# Patient Record
Sex: Male | Born: 2000 | Hispanic: Yes | Marital: Single | Smoking: Never smoker
Health system: Southern US, Community
[De-identification: ages and names within clinical notes are randomized; demographics above are authoritative.]

## PROBLEM LIST (undated history)

## (undated) ENCOUNTER — Emergency Department (HOSPITAL_COMMUNITY): Admission: EM | Payer: Self-pay | Source: Home / Self Care

---
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
## 2018-01-25 ENCOUNTER — Emergency Department (HOSPITAL_COMMUNITY)
Admission: EM | Admit: 2018-01-25 | Discharge: 2018-01-25 | Disposition: A | Discharge status: Home / Self Care | Payer: Self-pay | Attending: Emergency Medicine | Admitting: Emergency Medicine

## 2018-01-25 ENCOUNTER — Encounter (HOSPITAL_COMMUNITY): Payer: Self-pay | Admitting: Nurse Practitioner

## 2018-01-25 DIAGNOSIS — F10120 Alcohol abuse with intoxication, uncomplicated: Secondary | ICD-10-CM | POA: Insufficient documentation

## 2018-01-25 DIAGNOSIS — S61411A Laceration without foreign body of right hand, initial encounter: Secondary | ICD-10-CM | POA: Insufficient documentation

## 2018-01-25 DIAGNOSIS — W228XXA Striking against or struck by other objects, initial encounter: Secondary | ICD-10-CM | POA: Insufficient documentation

## 2018-01-25 DIAGNOSIS — Y929 Unspecified place or not applicable: Secondary | ICD-10-CM | POA: Insufficient documentation

## 2018-01-25 DIAGNOSIS — Y999 Unspecified external cause status: Secondary | ICD-10-CM | POA: Insufficient documentation

## 2018-01-25 DIAGNOSIS — F10921 Alcohol use, unspecified with intoxication delirium: Secondary | ICD-10-CM

## 2018-01-25 DIAGNOSIS — Y939 Activity, unspecified: Secondary | ICD-10-CM | POA: Insufficient documentation
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
## 2018-01-25 NOTE — Discharge Instructions (Addendum)
As discussed, your evaluation today has been largely reassuring.  But, it is important that you monitor your condition carefully, and do not hesitate to return to the ED if you develop new, or concerning changes in your condition. ? ?Otherwise, please follow-up with your physician for appropriate ongoing care. ? ?
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
## 2020-07-19 ENCOUNTER — Emergency Department (HOSPITAL_COMMUNITY): Admission: EM | Admit: 2020-07-19 | Discharge: 2020-07-19 | Discharge status: Absent without leave | Payer: Self-pay

## 2022-04-08 ENCOUNTER — Emergency Department (HOSPITAL_COMMUNITY)
Admission: EM | Admit: 2022-04-08 | Discharge: 2022-04-08 | Disposition: A | Discharge status: Home / Self Care | Payer: Self-pay | Attending: Emergency Medicine | Admitting: Emergency Medicine

## 2022-04-08 ENCOUNTER — Other Ambulatory Visit: Payer: Self-pay

## 2022-04-08 DIAGNOSIS — F10921 Alcohol use, unspecified with intoxication delirium: Secondary | ICD-10-CM

## 2022-04-08 DIAGNOSIS — F10121 Alcohol abuse with intoxication delirium: Secondary | ICD-10-CM | POA: Insufficient documentation

## 2022-04-08 DIAGNOSIS — Y908 Blood alcohol level of 240 mg/100 ml or more: Secondary | ICD-10-CM | POA: Insufficient documentation

## 2022-04-08 DIAGNOSIS — R410 Disorientation, unspecified: Secondary | ICD-10-CM | POA: Insufficient documentation

## 2022-04-08 LAB — CBC WITH DIFFERENTIAL/PLATELET
Abs Immature Granulocytes: 0.19 10*3/uL — ABNORMAL HIGH (ref 0.00–0.07)
Basophils Absolute: 0 10*3/uL (ref 0.0–0.1)
Basophils Relative: 0 %
Eosinophils Absolute: 0 10*3/uL (ref 0.0–0.5)
Eosinophils Relative: 0 %
HCT: 43.8 % (ref 39.0–52.0)
Hemoglobin: 14.7 g/dL (ref 13.0–17.0)
Immature Granulocytes: 2 %
Lymphocytes Relative: 17 %
Lymphs Abs: 1.8 10*3/uL (ref 0.7–4.0)
MCH: 30.1 pg (ref 26.0–34.0)
MCHC: 33.6 g/dL (ref 30.0–36.0)
MCV: 89.6 fL (ref 80.0–100.0)
Monocytes Absolute: 0.4 10*3/uL (ref 0.1–1.0)
Monocytes Relative: 4 %
Neutro Abs: 8.2 10*3/uL — ABNORMAL HIGH (ref 1.7–7.7)
Neutrophils Relative %: 77 %
Platelets: 206 10*3/uL (ref 150–400)
RBC: 4.89 MIL/uL (ref 4.22–5.81)
RDW: 13.2 % (ref 11.5–15.5)
WBC: 10.7 10*3/uL — ABNORMAL HIGH (ref 4.0–10.5)
nRBC: 0 % (ref 0.0–0.2)

## 2022-04-08 LAB — COMPREHENSIVE METABOLIC PANEL
ALT: 38 U/L (ref 0–44)
AST: 34 U/L (ref 15–41)
Albumin: 4.2 g/dL (ref 3.5–5.0)
Alkaline Phosphatase: 53 U/L (ref 38–126)
Anion gap: 10 (ref 5–15)
BUN: 16 mg/dL (ref 6–20)
CO2: 22 mmol/L (ref 22–32)
Calcium: 8.9 mg/dL (ref 8.9–10.3)
Chloride: 111 mmol/L (ref 98–111)
Creatinine, Ser: 0.85 mg/dL (ref 0.61–1.24)
GFR, Estimated: >60 mL/min (ref >60)
Glucose, Bld: 94 mg/dL (ref 70–99)
Potassium: 3.4 mmol/L — ABNORMAL LOW (ref 3.5–5.1)
Sodium: 143 mmol/L (ref 135–145)
Total Bilirubin: 0.7 mg/dL (ref 0.3–1.2)
Total Protein: 7.5 g/dL (ref 6.5–8.1)

## 2022-04-08 LAB — SALICYLATE LEVEL: Salicylate Lvl: <7 mg/dL — ABNORMAL LOW (ref 7.0–30.0)

## 2022-04-08 LAB — ETHANOL: Alcohol, Ethyl (B): 286 mg/dL — ABNORMAL HIGH (ref <10)

## 2022-04-08 LAB — CBG MONITORING, ED: Glucose-Capillary: 128 mg/dL — ABNORMAL HIGH (ref 70–99)

## 2022-04-08 LAB — ACETAMINOPHEN LEVEL: Acetaminophen (Tylenol), Serum: <10 ug/mL — ABNORMAL LOW (ref 10–30)

## 2022-04-08 MED ORDER — MIDAZOLAM HCL 2 MG/2ML IJ SOLN: 2.0000 mg | Freq: Once | INTRAMUSCULAR | Status: DC

## 2022-04-08 MED ORDER — SODIUM CHLORIDE 0.9 % IV BOLUS
1000.0000 mL | Freq: Once | INTRAVENOUS | Status: AC
  Administered 2022-04-08: 1000 mL via INTRAVENOUS

## 2022-04-08 MED ORDER — DIPHENHYDRAMINE HCL 50 MG/ML IJ SOLN
50.0000 mg | Freq: Once | INTRAMUSCULAR | Status: DC
  Filled 2022-04-08: qty 1

## 2022-04-08 NOTE — ED Triage Notes (Addendum)
BIB [facility] from car outside of work. Pt was slurring words, stumbling, and laying on table at work after drinking fifth of liquor.  When ems arrived pt started swinging, pulling of monitor, pulled out IV. Patient was placed in restraints by ems and given 5mg  of versed On arrival patient is swinging at staff, trying to bite, and spitting on staff. Restraints applied and MD, [employer],  and security in room

## 2022-04-08 NOTE — Discharge Instructions (Signed)
You are seen in the ER for change in your mental status.  The cause for the change is alcohol intoxication.  Please hydrate well.  Do not use any heavy machinery rest of the day.  Do not drive today.

## 2022-04-08 NOTE — ED Notes (Signed)
Pt in and out cath without distress.  Respirations even and unlabored.

## 2022-04-08 NOTE — ED Provider Notes (Signed)
[facility]-EMERGENCY DEPT Provider Note   CSN: [id number] Arrival date & time: 04/08/22  1551     History  Chief Complaint  Patient presents with   Alcohol Intoxication    Sean Haas is a 21 y.o. male.  HPI    21 year old male comes in with chief complaint of alcohol intoxication. Level 5 caveat for combative patient.  EMS brought the patient from work.  Allegedly, patient was found in his car, outside of work, passed out.  Patient was then noted to be slurring, stumbling and he walked in to work and decided to take a nap at work.  EMS was called by colleagues, when they arrived patient started swinging at them.  EMS had already given patient family gram of IM Versed prior to ED arrival.  Patient still combative towards our staff.  Later on patient's stay, patient more alert.  States that he did drink earlier today, mainly because he was bored.  He has no SI, HI.  He denies any other drug use.  Girlfriend at the bedside states that patient does not have history of similar behavior and denies any known substance use disorder.  Home Medications Prior to Admission medications   Not on File      Allergies    Patient has no allergy information on record.    Review of Systems   Review of Systems  Physical Exam Updated Vital Signs BP 103/61   Pulse 60   Temp 98.2 F (36.8 C)   Resp 17   SpO2 99%  Physical Exam Vitals and nursing note reviewed.  Constitutional:      General: He is not in acute distress.    Appearance: He is well-developed and normal weight. He is not ill-appearing, toxic-appearing or diaphoretic.  HENT:     Head: Atraumatic.  Eyes:     Comments: Pupils are pinpoint  Cardiovascular:     Rate and Rhythm: Normal rate.  Pulmonary:     Effort: Pulmonary effort is normal.  Abdominal:     Tenderness: There is no abdominal tenderness.  Musculoskeletal:     Cervical back: Neck supple.  Skin:    General: Skin is warm.   Neurological:     Mental Status: He is disoriented.     ED Results / Procedures / Treatments   Labs (all labs ordered are listed, but only abnormal results are displayed) Labs Reviewed  COMPREHENSIVE METABOLIC PANEL - Abnormal; Notable for the following components:      Result Value   Potassium 3.4 (*)    All other components within normal limits  SALICYLATE LEVEL - Abnormal; Notable for the following components:   Salicylate Lvl <7.0 (*)    All other components within normal limits  ACETAMINOPHEN LEVEL - Abnormal; Notable for the following components:   Acetaminophen (Tylenol), Serum <10 (*)    All other components within normal limits  ETHANOL - Abnormal; Notable for the following components:   Alcohol, Ethyl (B) 286 (*)    All other components within normal limits  CBC WITH DIFFERENTIAL/PLATELET - Abnormal; Notable for the following components:   WBC 10.7 (*)    Neutro Abs 8.2 (*)    Abs Immature Granulocytes 0.19 (*)    All other components within normal limits  CBG MONITORING, ED - Abnormal; Notable for the following components:   Glucose-Capillary 128 (*)    All other components within normal limits  RAPID URINE DRUG SCREEN, HOSP PERFORMED    EKG None  Radiology No results found.  Procedures .Critical Care  Performed by: Derwood Kaplan, MD Authorized by: Derwood Kaplan, MD   Critical care provider statement:    Critical care time (minutes):  32   Critical care was necessary to treat or prevent imminent or life-threatening deterioration of the following conditions:  Toxidrome and CNS failure or compromise   Critical care was time spent personally by me on the following activities:  Development of treatment plan with patient or surrogate, evaluation of patient's response to treatment, examination of patient, ordering and review of laboratory studies, ordering and review of radiographic studies, ordering and performing treatments and interventions, pulse  oximetry, re-evaluation of patient's condition, review of old charts and obtaining history from patient or surrogate     Medications Ordered in ED Medications  midazolam (VERSED) injection 2 mg (2 mg Intravenous Not Given 04/08/22 1824)  diphenhydrAMINE (BENADRYL) injection 50 mg (50 mg Intravenous Not Given 04/08/22 1825)  sodium chloride 0.9 % bolus 1,000 mL (0 mLs Intravenous Stopped 04/08/22 2035)    ED Course/ Medical Decision Making/ A&P                           Medical Decision Making Amount and/or Complexity of Data Reviewed Labs: ordered.  Risk Prescription drug management.   21 year old patient comes in with chief complaint of acute mental status change. He is combative, belligerent and already has received IM Versed prior to ED arrival.  Patient has tenuous mental status, he started swinging at our staff, but then is noted to be sleeping.  He was given IM Haldol because of combative behavior, required physical restraints.  Patient reassessed every 1 hour by me.  Ultimately he became more alert, and girlfriend came as well to give [place] further collateral history.  History provided by patient.  History also provided by EMS and girlfriend.  After 4-1/2 hours into patient's stay, he is more alert, he is ambulated and girlfriend is willing to take him home.  Labs reviewed.  There is high ethanol level.  Rest of the labs are reassuring.  No indication for CT scan of the brain.    Final Clinical Impression(s) / ED Diagnoses Final diagnoses:  Alcohol intoxication with delirium [facility])    Rx / DC Orders ED Discharge Orders     None         Derwood Kaplan, MD 04/08/22 2213
